# Patient Record
Sex: Female | Born: 2000 | Race: White | Hispanic: No | Marital: Single
Health system: Southern US, Community
[De-identification: ages and names within clinical notes are randomized; demographics above are authoritative.]

## PROBLEM LIST (undated history)

## (undated) DIAGNOSIS — K219 Gastro-esophageal reflux disease without esophagitis: Secondary | ICD-10-CM

## (undated) HISTORY — PX: APPENDECTOMY: SHX54

## (undated) HISTORY — PX: TONSILECTOMY, ADENOIDECTOMY, BILATERAL MYRINGOTOMY AND TUBES: SHX2538

## (undated) HISTORY — PX: TONSILLECTOMY: SUR1361

## (undated) HISTORY — PX: GASTROSTOMY TUBE PLACEMENT: SHX655

---
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
## 2021-09-19 ENCOUNTER — Other Ambulatory Visit: Payer: Self-pay | Admitting: Pediatrics

## 2021-09-19 DIAGNOSIS — Z0001 Encounter for general adult medical examination with abnormal findings: Secondary | ICD-10-CM | POA: Diagnosis not present

## 2021-09-19 DIAGNOSIS — Z131 Encounter for screening for diabetes mellitus: Secondary | ICD-10-CM | POA: Diagnosis not present

## 2021-09-19 DIAGNOSIS — R7989 Other specified abnormal findings of blood chemistry: Secondary | ICD-10-CM | POA: Diagnosis not present

## 2021-09-19 DIAGNOSIS — Z6823 Body mass index (BMI) 23.0-23.9, adult: Secondary | ICD-10-CM | POA: Diagnosis not present

## 2021-09-19 DIAGNOSIS — N926 Irregular menstruation, unspecified: Secondary | ICD-10-CM

## 2021-09-19 NOTE — Telephone Encounter (Signed)
This patient should have transferred care. Call patient/ parent and inquire as to who she is now seeing for healthcare general or gynecological care.

## 2021-09-19 NOTE — Telephone Encounter (Signed)
She is seeing Dr.Shaw with [facility]  She is actually over there right now for an appointment

## 2021-09-20 NOTE — Telephone Encounter (Signed)
Thanks. Will deny refill request.

## 2021-12-25 DIAGNOSIS — R7989 Other specified abnormal findings of blood chemistry: Secondary | ICD-10-CM | POA: Diagnosis not present

## 2022-03-12 DIAGNOSIS — E039 Hypothyroidism, unspecified: Secondary | ICD-10-CM | POA: Diagnosis not present

## 2022-03-17 DIAGNOSIS — Z20822 Contact with and (suspected) exposure to covid-19: Secondary | ICD-10-CM | POA: Diagnosis not present

## 2022-03-17 DIAGNOSIS — J020 Streptococcal pharyngitis: Secondary | ICD-10-CM | POA: Diagnosis not present

## 2022-03-17 DIAGNOSIS — Z1159 Encounter for screening for other viral diseases: Secondary | ICD-10-CM | POA: Diagnosis not present

## 2022-03-17 DIAGNOSIS — J101 Influenza due to other identified influenza virus with other respiratory manifestations: Secondary | ICD-10-CM | POA: Diagnosis not present

## 2022-05-23 DIAGNOSIS — E041 Nontoxic single thyroid nodule: Secondary | ICD-10-CM | POA: Diagnosis not present

## 2022-07-10 DIAGNOSIS — E559 Vitamin D deficiency, unspecified: Secondary | ICD-10-CM | POA: Diagnosis not present

## 2022-07-10 DIAGNOSIS — E039 Hypothyroidism, unspecified: Secondary | ICD-10-CM | POA: Diagnosis not present

## 2022-08-21 DIAGNOSIS — E039 Hypothyroidism, unspecified: Secondary | ICD-10-CM | POA: Diagnosis not present

## 2022-08-21 DIAGNOSIS — E559 Vitamin D deficiency, unspecified: Secondary | ICD-10-CM | POA: Diagnosis not present

## 2022-09-06 ENCOUNTER — Encounter: Payer: Self-pay | Admitting: *Deleted

## 2022-09-23 DIAGNOSIS — E559 Vitamin D deficiency, unspecified: Secondary | ICD-10-CM | POA: Diagnosis not present

## 2022-09-23 DIAGNOSIS — E039 Hypothyroidism, unspecified: Secondary | ICD-10-CM | POA: Diagnosis not present

## 2022-12-04 DIAGNOSIS — K219 Gastro-esophageal reflux disease without esophagitis: Secondary | ICD-10-CM | POA: Diagnosis not present

## 2022-12-04 DIAGNOSIS — R0689 Other abnormalities of breathing: Secondary | ICD-10-CM | POA: Diagnosis not present

## 2022-12-04 DIAGNOSIS — Z79890 Hormone replacement therapy (postmenopausal): Secondary | ICD-10-CM | POA: Diagnosis not present

## 2022-12-04 DIAGNOSIS — E039 Hypothyroidism, unspecified: Secondary | ICD-10-CM | POA: Diagnosis not present

## 2022-12-11 DIAGNOSIS — E039 Hypothyroidism, unspecified: Secondary | ICD-10-CM | POA: Diagnosis not present

## 2022-12-11 DIAGNOSIS — Z131 Encounter for screening for diabetes mellitus: Secondary | ICD-10-CM | POA: Diagnosis not present

## 2022-12-11 DIAGNOSIS — E559 Vitamin D deficiency, unspecified: Secondary | ICD-10-CM | POA: Diagnosis not present

## 2022-12-11 DIAGNOSIS — R519 Headache, unspecified: Secondary | ICD-10-CM | POA: Diagnosis not present

## 2022-12-11 DIAGNOSIS — R634 Abnormal weight loss: Secondary | ICD-10-CM | POA: Diagnosis not present

## 2022-12-11 DIAGNOSIS — R232 Flushing: Secondary | ICD-10-CM | POA: Diagnosis not present

## 2022-12-26 DIAGNOSIS — L700 Acne vulgaris: Secondary | ICD-10-CM | POA: Diagnosis not present

## 2022-12-26 DIAGNOSIS — Z79899 Other long term (current) drug therapy: Secondary | ICD-10-CM | POA: Diagnosis not present

## 2023-01-03 ENCOUNTER — Other Ambulatory Visit (HOSPITAL_BASED_OUTPATIENT_CLINIC_OR_DEPARTMENT_OTHER): Payer: Self-pay

## 2023-01-03 MED ORDER — KYLEENA 19.5 MG IU IUD: INTRAUTERINE_SYSTEM | INTRAUTERINE | 0 refills | Status: AC

## 2023-01-16 DIAGNOSIS — Z79899 Other long term (current) drug therapy: Secondary | ICD-10-CM | POA: Diagnosis not present

## 2023-04-02 DIAGNOSIS — K130 Diseases of lips: Secondary | ICD-10-CM | POA: Diagnosis not present

## 2023-04-02 DIAGNOSIS — L853 Xerosis cutis: Secondary | ICD-10-CM | POA: Diagnosis not present

## 2023-04-02 DIAGNOSIS — L271 Localized skin eruption due to drugs and medicaments taken internally: Secondary | ICD-10-CM | POA: Diagnosis not present

## 2023-04-02 DIAGNOSIS — Z79899 Other long term (current) drug therapy: Secondary | ICD-10-CM | POA: Diagnosis not present

## 2023-04-02 DIAGNOSIS — L700 Acne vulgaris: Secondary | ICD-10-CM | POA: Diagnosis not present

## 2023-04-15 DIAGNOSIS — Z6822 Body mass index (BMI) 22.0-22.9, adult: Secondary | ICD-10-CM | POA: Diagnosis not present

## 2023-04-15 DIAGNOSIS — J069 Acute upper respiratory infection, unspecified: Secondary | ICD-10-CM | POA: Diagnosis not present

## 2023-05-05 DIAGNOSIS — Z79899 Other long term (current) drug therapy: Secondary | ICD-10-CM | POA: Diagnosis not present

## 2023-05-05 DIAGNOSIS — R040 Epistaxis: Secondary | ICD-10-CM | POA: Diagnosis not present

## 2023-05-05 DIAGNOSIS — L271 Localized skin eruption due to drugs and medicaments taken internally: Secondary | ICD-10-CM | POA: Diagnosis not present

## 2023-05-05 DIAGNOSIS — L700 Acne vulgaris: Secondary | ICD-10-CM | POA: Diagnosis not present

## 2023-06-09 DIAGNOSIS — R040 Epistaxis: Secondary | ICD-10-CM | POA: Diagnosis not present

## 2023-06-09 DIAGNOSIS — L700 Acne vulgaris: Secondary | ICD-10-CM | POA: Diagnosis not present

## 2023-06-09 DIAGNOSIS — L271 Localized skin eruption due to drugs and medicaments taken internally: Secondary | ICD-10-CM | POA: Diagnosis not present

## 2023-06-09 DIAGNOSIS — K130 Diseases of lips: Secondary | ICD-10-CM | POA: Diagnosis not present

## 2023-06-09 DIAGNOSIS — Z79899 Other long term (current) drug therapy: Secondary | ICD-10-CM | POA: Diagnosis not present

## 2023-07-10 DIAGNOSIS — K130 Diseases of lips: Secondary | ICD-10-CM | POA: Diagnosis not present

## 2023-07-10 DIAGNOSIS — L700 Acne vulgaris: Secondary | ICD-10-CM | POA: Diagnosis not present

## 2023-07-10 DIAGNOSIS — L853 Xerosis cutis: Secondary | ICD-10-CM | POA: Diagnosis not present

## 2023-07-10 DIAGNOSIS — L271 Localized skin eruption due to drugs and medicaments taken internally: Secondary | ICD-10-CM | POA: Diagnosis not present

## 2023-07-10 DIAGNOSIS — Z79899 Other long term (current) drug therapy: Secondary | ICD-10-CM | POA: Diagnosis not present

## 2023-09-02 ENCOUNTER — Other Ambulatory Visit (HOSPITAL_COMMUNITY): Payer: Self-pay

## 2023-09-02 ENCOUNTER — Other Ambulatory Visit: Payer: Self-pay

## 2023-09-11 ENCOUNTER — Other Ambulatory Visit: Payer: Self-pay

## 2023-09-24 ENCOUNTER — Other Ambulatory Visit: Payer: Self-pay

## 2023-12-18 DIAGNOSIS — F331 Major depressive disorder, recurrent, moderate: Secondary | ICD-10-CM | POA: Diagnosis not present

## 2023-12-18 DIAGNOSIS — D509 Iron deficiency anemia, unspecified: Secondary | ICD-10-CM | POA: Diagnosis not present

## 2023-12-18 DIAGNOSIS — Z7689 Persons encountering health services in other specified circumstances: Secondary | ICD-10-CM | POA: Diagnosis not present

## 2023-12-18 DIAGNOSIS — E0590 Thyrotoxicosis, unspecified without thyrotoxic crisis or storm: Secondary | ICD-10-CM | POA: Diagnosis not present

## 2023-12-18 DIAGNOSIS — Z1389 Encounter for screening for other disorder: Secondary | ICD-10-CM | POA: Diagnosis not present

## 2023-12-18 DIAGNOSIS — F419 Anxiety disorder, unspecified: Secondary | ICD-10-CM | POA: Diagnosis not present

## 2024-01-14 DIAGNOSIS — N898 Other specified noninflammatory disorders of vagina: Secondary | ICD-10-CM | POA: Diagnosis not present
